# Patient Record
Sex: Male | Born: 1960
Health system: Southern US, Community
[De-identification: ages and names within clinical notes are randomized; demographics above are authoritative.]

## PROBLEM LIST (undated history)

## (undated) DIAGNOSIS — S61011A Laceration without foreign body of right thumb without damage to nail, initial encounter: Secondary | ICD-10-CM

---

## 1998-07-08 ENCOUNTER — Ambulatory Visit (HOSPITAL_COMMUNITY): Admission: RE | Admit: 1998-07-08 | Discharge: 1998-07-08 | Payer: Self-pay | Admitting: *Deleted

## 2013-04-16 ENCOUNTER — Ambulatory Visit (INDEPENDENT_AMBULATORY_CARE_PROVIDER_SITE_OTHER): Payer: PRIVATE HEALTH INSURANCE | Admitting: Physician Assistant

## 2013-04-16 VITALS — BP 116/74 | HR 80 | Temp 98.0°F | Resp 16 | Ht 68.0 in | Wt 151.6 lb

## 2013-04-16 DIAGNOSIS — J209 Acute bronchitis, unspecified: Secondary | ICD-10-CM

## 2013-04-16 DIAGNOSIS — R05 Cough: Secondary | ICD-10-CM

## 2013-04-16 DIAGNOSIS — R059 Cough, unspecified: Secondary | ICD-10-CM

## 2013-04-16 MED ORDER — AZITHROMYCIN 250 MG PO TABS: ORAL_TABLET | ORAL | Status: DC

## 2013-04-16 MED ORDER — HYDROCODONE-HOMATROPINE 5-1.5 MG/5ML PO SYRP: 5.0000 mL | ORAL_SOLUTION | Freq: Three times a day (TID) | ORAL | Status: DC | PRN

## 2013-04-16 NOTE — Progress Notes (Signed)
  Subjective:    Patient ID: Gene Nelson, male    DOB: 1960-10-09, 52 y.o.   MRN: [id number]  Sore Throat  Associated symptoms include congestion and coughing. Pertinent negatives include no ear pain, headaches, shortness of breath or vomiting.  Cough Associated symptoms include postnasal drip, rhinorrhea and a sore throat. Pertinent negatives include no chest pain, chills, ear pain, fever, headaches, shortness of breath or wheezing.   52 year old male presents for evaluation of cough, PND, and nasal congestion x 5 days. States symptoms started like a cold but he has now developed a productive cough.  Had low grade fever of 99.0  At the beginning but that has resolved. He has been afebrile x 3 days.  Has taken ibuprofen and an OTC sinus medications which did seem to help.  Denies SOB, wheezing, otalgia, sinus pain, chest pain, headache, or dizziness.   Patient is otherwise healthy with no other concerns today.     Review of Systems  Constitutional: Negative for fever and chills.  HENT: Positive for congestion, postnasal drip, rhinorrhea and sore throat. Negative for ear pain and sinus pressure.   Respiratory: Positive for cough. Negative for shortness of breath and wheezing.   Cardiovascular: Negative for chest pain.  Gastrointestinal: Negative for nausea and vomiting.  Neurological: Negative for dizziness and headaches.       Objective:   Physical Exam  Constitutional: He is oriented to person, place, and time. He appears well-developed and well-nourished.  HENT:  Head: Normocephalic and atraumatic.  Right Ear: Hearing, tympanic membrane, external ear and ear canal normal.  Left Ear: Hearing, tympanic membrane, external ear and ear canal normal.  Mouth/Throat: Uvula is midline, oropharynx is clear and moist and mucous membranes are normal. No oropharyngeal exudate (clear postnasal drainage).  Eyes: Conjunctivae are normal.  Neck: Normal range of motion. Neck supple.   Cardiovascular: Normal rate, regular rhythm and normal heart sounds.   Pulmonary/Chest: Effort normal and breath sounds normal.  Lymphadenopathy:    He has no cervical adenopathy.  Neurological: He is alert and oriented to person, place, and time.  Psychiatric: He has a normal mood and affect. His behavior is normal. Judgment and thought content normal.          Assessment & Plan:  Acute bronchitis - Plan: azithromycin (ZITHROMAX) 250 MG tablet  Cough - Plan: HYDROcodone-homatropine (HYCODAN) 5-1.5 MG/5ML syrup  Will cover with Zpack - use as directed Hycodan qhs prn cough. May use OTC Delsym during the day Increase fluids and rest.  Follow up if symptoms worsen or fail to improve

## 2014-04-20 ENCOUNTER — Ambulatory Visit (INDEPENDENT_AMBULATORY_CARE_PROVIDER_SITE_OTHER): Payer: BC Managed Care – PPO | Admitting: Podiatrist

## 2014-04-20 ENCOUNTER — Ambulatory Visit: Payer: PRIVATE HEALTH INSURANCE | Admitting: Podiatrist

## 2014-04-20 ENCOUNTER — Encounter: Payer: Self-pay | Admitting: Podiatrist

## 2014-04-20 VITALS — BP 136/85 | HR 66 | Resp 16

## 2014-04-20 DIAGNOSIS — L84 Corns and callosities: Secondary | ICD-10-CM

## 2014-04-20 NOTE — Progress Notes (Signed)
   Subjective:    Patient ID: Gene Nelson, male    DOB: 09/02/1960, 53 y.o.   MRN: [id number]  HPI Comments: "I have these things on my feet"  Patient c/o tender, tiny callused areas plantar forefoot bilateral and heel left for several months. He has tried OTC meds-helped temp, but came back.     Review of Systems  All other systems reviewed and are negative.      Objective:   Physical Exam Patient is awake, alert, and oriented x 3.  In no acute distress.  Vascular status is intact with palpable pedal pulses at 2/4 DP and PT bilateral and capillary refill time within normal limits. Neurological sensation is also intact bilaterally via Semmes Weinstein monofilament at 5/5 sites. Light touch, vibratory sensation, Achilles tendon reflex is intact. Dermatological exam reveals skin color, turger and texture as normal. No open lesions present.  Musculature intact with dorsiflexion, plantarflexion, inversion, eversion.  Bilateral feet have a line of porokeratotic lesions present in the crease between the first and second metatarsals.  Intact integument is noted post debridement.  No sign of infection present.  No redness, swelling or drainage present.  Patient subjectively relates pain in the feet.      Assessment & Plan:  Calluses bilateral feet  Plan:  Debridement of lesions carried out today without complication with a 15 blade. He will be seen back as needed for follow up.

## 2014-04-20 NOTE — Patient Instructions (Signed)
Corns and Calluses Corns are small areas of thickened skin that usually occur on the top, sides, or tip of a toe. They contain a cone-shaped core with a point that can press on a nerve below. This causes pain. Calluses are areas of thickened skin that usually develop on hands, fingers, palms, soles of the feet, and heels. These are areas that experience frequent friction or pressure. CAUSES  Corns are usually the result of rubbing (friction) or pressure from shoes that are too tight or do not fit properly. Calluses are caused by repeated friction and pressure on the affected areas. SYMPTOMS  A hard growth on the skin.  Pain or tenderness under the skin.  Sometimes, redness and swelling.  Increased discomfort while wearing tight-fitting shoes. DIAGNOSIS  Your caregiver can usually tell what the problem is by doing a physical exam. TREATMENT  Removing the cause of the friction or pressure is usually the only treatment needed. However, sometimes medicines can be used to help soften the hardened, thickened areas. These medicines include salicylic acid plasters and 12% ammonium lactate lotion. These medicines should only be used under the direction of your caregiver. HOME CARE INSTRUCTIONS   Try to remove pressure from the affected area.  You may wear donut-shaped corn pads to protect your skin.  You may use a pumice stone or nonmetallic nail file to gently reduce the thickness of a corn.  Wear properly fitted footwear.  If you have calluses on the hands, wear gloves during activities that cause friction.  If you have diabetes, you should regularly examine your feet. Tell your caregiver if you notice any problems with your feet. SEEK IMMEDIATE MEDICAL CARE IF:   You have increased pain, swelling, redness, or warmth in the affected area.  Your corn or callus starts to drain fluid or bleeds.  You are not getting better, even with treatment. Document Released: 02/29/2004 Document  Revised: 08/17/2011 Document Reviewed: 01/20/2011 Exit[employer] Patient Information 2015 [employer]. This information is not intended to replace advice given to you by your health care provider. Make sure you discuss any questions you have with your health care provider.  

## 2019-10-24 DIAGNOSIS — Z1211 Encounter for screening for malignant neoplasm of colon: Secondary | ICD-10-CM | POA: Diagnosis not present

## 2019-10-24 DIAGNOSIS — Z6823 Body mass index (BMI) 23.0-23.9, adult: Secondary | ICD-10-CM | POA: Diagnosis not present

## 2019-10-24 DIAGNOSIS — Z0000 Encounter for general adult medical examination without abnormal findings: Secondary | ICD-10-CM | POA: Diagnosis not present

## 2019-10-24 DIAGNOSIS — Z13220 Encounter for screening for lipoid disorders: Secondary | ICD-10-CM | POA: Diagnosis not present

## 2019-11-27 DIAGNOSIS — Z0000 Encounter for general adult medical examination without abnormal findings: Secondary | ICD-10-CM | POA: Diagnosis not present

## 2019-11-27 DIAGNOSIS — Z1211 Encounter for screening for malignant neoplasm of colon: Secondary | ICD-10-CM | POA: Diagnosis not present

## 2020-04-24 ENCOUNTER — Other Ambulatory Visit: Payer: Self-pay

## 2020-04-24 ENCOUNTER — Encounter (HOSPITAL_COMMUNITY): Payer: Self-pay | Admitting: *Deleted

## 2020-04-24 ENCOUNTER — Emergency Department (HOSPITAL_COMMUNITY)
Admission: EM | Admit: 2020-04-24 | Discharge: 2020-04-25 | Disposition: A | Discharge status: Home / Self Care | Payer: No Typology Code available for payment source | Attending: Emergency Medicine | Admitting: Emergency Medicine

## 2020-04-24 DIAGNOSIS — W268XXA Contact with other sharp object(s), not elsewhere classified, initial encounter: Secondary | ICD-10-CM | POA: Insufficient documentation

## 2020-04-24 DIAGNOSIS — Y990 Civilian activity done for income or pay: Secondary | ICD-10-CM | POA: Diagnosis not present

## 2020-04-24 DIAGNOSIS — S61011A Laceration without foreign body of right thumb without damage to nail, initial encounter: Secondary | ICD-10-CM | POA: Insufficient documentation

## 2020-04-24 DIAGNOSIS — Y9289 Other specified places as the place of occurrence of the external cause: Secondary | ICD-10-CM | POA: Diagnosis not present

## 2020-04-24 DIAGNOSIS — S61001A Unspecified open wound of right thumb without damage to nail, initial encounter: Secondary | ICD-10-CM | POA: Diagnosis not present

## 2020-04-24 DIAGNOSIS — S66221A Laceration of extensor muscle, fascia and tendon of right thumb at wrist and hand level, initial encounter: Secondary | ICD-10-CM | POA: Diagnosis not present

## 2020-04-24 NOTE — ED Triage Notes (Signed)
Pt arrives from [facility] facility. Laceration to the right thumb area from blade while trying to cut something at work today. Per their note, pt is unable to extend his thumb and they sent him here for further eval. Pt says when he tries to extend his thumb he feels some discomfort in his right forearm area. Tetanus rec'd at [facility].

## 2020-04-25 MED ORDER — LIDOCAINE HCL (PF) 1 % IJ SOLN
5.0000 mL | Freq: Once | INTRAMUSCULAR | Status: AC
  Administered 2020-04-25: 5 mL
  Filled 2020-04-25: qty 5

## 2020-04-25 NOTE — Progress Notes (Signed)
Orthopedic Tech Progress Note Patient Details:  Gene Nelson [birth year] [id number]  Ortho Devices Type of Ortho Device: Finger splint Ortho Device/Splint Location: RUE Ortho Device/Splint Interventions: Ordered, Application, Adjustment   Post Interventions Patient Tolerated: Well Instructions Provided: Care of device   [name] 04/25/2020, 1:23 AM

## 2020-04-25 NOTE — Discharge Instructions (Signed)
Follow up with Dr. Melvyn Novas for recheck of possible tendon injury right thumb.   Sutures can be removed in 7-10 days by your doctor or as directed by Dr. Melvyn Novas.   Take Tylenol as needed - 1000 mg every 6 hours. Maximum dose 4000 mg in 24 hours.

## 2020-04-25 NOTE — ED Provider Notes (Signed)
[facility] EMERGENCY DEPARTMENT Provider Note   CSN: [id number] Arrival date & time: 04/24/20  2037     History Chief Complaint  Patient presents with  . Laceration    Gene Nelson is a 59 y.o. male.  Patient to ED from Urgent Care where he was initially evaluated for a laceration to right thumb that occurred earlier today. UC concerned about possible tendon injury. He reports he has trouble moving the thumb in a full circle and pain in the radial forearm when he attempts to extend the thumb. No other injury. No numbness.   The history is provided by the patient. No language interpreter was used.       History reviewed. No pertinent past medical history.  There are no problems to display for this patient.   History reviewed. No pertinent surgical history.     No family history on file.  Social History   Tobacco Use  . Smoking status: Never Smoker  Substance Use Topics  . Alcohol use: No  . Drug use: No    Home Medications Prior to Admission medications   Not on File    Allergies    Patient has no known allergies.  Review of Systems   Review of Systems  Musculoskeletal:       See HPI.  Skin: Positive for wound.  Neurological: Negative for numbness.    Physical Exam Updated Vital Signs BP (!) 157/92 (BP Location: Left Arm)   Pulse 81   Temp 97.8 F (36.6 C) (Oral)   Resp 16   SpO2 100%   Physical Exam Vitals and nursing note reviewed.  Constitutional:      Appearance: He is well-developed.  Pulmonary:     Effort: Pulmonary effort is normal.  Musculoskeletal:        General: Normal range of motion.     Cervical back: Normal range of motion.     Comments: Patient has FROM of the thumb passively and against resistance. Full extension limited.  Skin:    General: Skin is warm and dry.     Comments: 1.5 cm laceration right dorsal 1st MCP, full thickness. Bleeding controlled. No swelling.   Neurological:     Mental Status:  He is alert and oriented to person, place, and time.     ED Results / Procedures / Treatments   Labs (all labs ordered are listed, but only abnormal results are displayed) Labs Reviewed - No data to display  EKG None  Radiology No results found.  Procedures .Marland KitchenLaceration Repair  Date/Time: 04/25/2020 1:09 AM Performed by: Elpidio Anis, PA-C Authorized by: Elpidio Anis, PA-C   Consent:    Consent obtained:  Verbal   Consent given by:  Patient Anesthesia (see MAR for exact dosages):    Anesthesia method:  Local infiltration   Local anesthetic:  Lidocaine 1% w/o epi Laceration details:    Location:  Finger   Finger location:  R thumb   Length (cm):  1.5 Repair type:    Repair type:  Simple Pre-procedure details:    Preparation:  Patient was prepped and draped in usual sterile fashion Exploration:    Hemostasis achieved with:  Direct pressure   Wound exploration: wound explored through full range of motion and entire depth of wound probed and visualized     Wound extent: no foreign bodies/material noted     Contaminated: no   Treatment:    Area cleansed with:  Betadine and saline Skin repair:  Repair method:  Sutures   Suture size:  5-0   Suture material:  Prolene   Suture technique:  Simple interrupted   Number of sutures:  3 Approximation:    Approximation:  Close Post-procedure details:    Dressing:  Antibiotic ointment and non-adherent dressing Comments:     No visualized tendon rupture. Finger splinted.   (including critical care time)  Medications Ordered in ED Medications  lidocaine (PF) (XYLOCAINE) 1 % injection 5 mL (has no administration in time range)    ED Course  I have reviewed the triage vital signs and the nursing notes.  Pertinent labs & imaging results that were available during my care of the patient were reviewed by me and considered in my medical decision making (see chart for details).    MDM Rules/Calculators/A&P                           Patient to ED for further evaluation of right thumb injury.   Small full thickness laceration. He has a FROM with limitation of full extension. Wound repaired, finger splinted. Will refer to hand ortho for repeat exam to evaluate for partial tendon injury.  Final Clinical Impression(s) / ED Diagnoses Final diagnoses:  None   1. Laceration right thumb  Rx / DC Orders ED Discharge Orders    None       Danne Harbor 04/25/20 0112    Zadie Rhine, MD 04/25/20 [phone]

## 2020-04-30 ENCOUNTER — Other Ambulatory Visit: Payer: Self-pay

## 2020-04-30 ENCOUNTER — Encounter (HOSPITAL_BASED_OUTPATIENT_CLINIC_OR_DEPARTMENT_OTHER): Payer: Self-pay | Admitting: Orthopaedic Surgery

## 2020-05-03 ENCOUNTER — Other Ambulatory Visit (HOSPITAL_COMMUNITY)
Admission: RE | Admit: 2020-05-03 | Discharge: 2020-05-03 | Disposition: A | Discharge status: Home / Self Care | Payer: BC Managed Care – PPO | Source: Ambulatory Visit | Attending: Orthopaedic Surgery | Admitting: Orthopaedic Surgery

## 2020-05-03 DIAGNOSIS — Z01812 Encounter for preprocedural laboratory examination: Secondary | ICD-10-CM | POA: Insufficient documentation

## 2020-05-03 DIAGNOSIS — Z20822 Contact with and (suspected) exposure to covid-19: Secondary | ICD-10-CM | POA: Insufficient documentation

## 2020-05-03 LAB — SARS CORONAVIRUS 2 (TAT 6-24 HRS): SARS Coronavirus 2: NEGATIVE

## 2020-05-06 ENCOUNTER — Ambulatory Visit (HOSPITAL_BASED_OUTPATIENT_CLINIC_OR_DEPARTMENT_OTHER): Payer: No Typology Code available for payment source | Admitting: Anesthesiology

## 2020-05-06 ENCOUNTER — Ambulatory Visit (HOSPITAL_BASED_OUTPATIENT_CLINIC_OR_DEPARTMENT_OTHER)
Admission: RE | Admit: 2020-05-06 | Discharge: 2020-05-06 | Disposition: A | Discharge status: Home / Self Care | Payer: No Typology Code available for payment source | Attending: Orthopaedic Surgery | Admitting: Orthopaedic Surgery

## 2020-05-06 ENCOUNTER — Encounter (HOSPITAL_BASED_OUTPATIENT_CLINIC_OR_DEPARTMENT_OTHER): Payer: Self-pay | Admitting: Orthopaedic Surgery

## 2020-05-06 ENCOUNTER — Encounter (HOSPITAL_BASED_OUTPATIENT_CLINIC_OR_DEPARTMENT_OTHER)
Admission: RE | Disposition: A | Discharge status: Home / Self Care | Payer: Self-pay | Source: Home / Self Care | Attending: Orthopaedic Surgery

## 2020-05-06 ENCOUNTER — Other Ambulatory Visit: Payer: Self-pay

## 2020-05-06 DIAGNOSIS — S66221A Laceration of extensor muscle, fascia and tendon of right thumb at wrist and hand level, initial encounter: Secondary | ICD-10-CM | POA: Insufficient documentation

## 2020-05-06 DIAGNOSIS — W268XXA Contact with other sharp object(s), not elsewhere classified, initial encounter: Secondary | ICD-10-CM | POA: Insufficient documentation

## 2020-05-06 DIAGNOSIS — S61011A Laceration without foreign body of right thumb without damage to nail, initial encounter: Secondary | ICD-10-CM | POA: Insufficient documentation

## 2020-05-06 HISTORY — DX: Laceration without foreign body of right thumb without damage to nail, initial encounter: S61.011A

## 2020-05-06 HISTORY — PX: REPAIR EXTENSOR TENDON: SHX5382

## 2020-05-06 SURGERY — REPAIR, TENDON, EXTENSOR
Anesthesia: Monitor Anesthesia Care | Site: Thumb | Laterality: Right

## 2020-05-06 MED ORDER — CEFAZOLIN SODIUM-DEXTROSE 2-4 GM/100ML-% IV SOLN
2.0000 g | INTRAVENOUS | Status: AC
  Administered 2020-05-06: 2 g via INTRAVENOUS

## 2020-05-06 MED ORDER — FENTANYL CITRATE (PF) 100 MCG/2ML IJ SOLN
100.0000 ug | Freq: Once | INTRAMUSCULAR | Status: AC
  Administered 2020-05-06: 100 ug via INTRAVENOUS

## 2020-05-06 MED ORDER — PROPOFOL 500 MG/50ML IV EMUL
INTRAVENOUS | Status: AC
  Filled 2020-05-06: qty 100

## 2020-05-06 MED ORDER — PROMETHAZINE HCL 25 MG/ML IJ SOLN: 6.2500 mg | INTRAMUSCULAR | Status: DC | PRN

## 2020-05-06 MED ORDER — LACTATED RINGERS IV SOLN: INTRAVENOUS | Status: DC

## 2020-05-06 MED ORDER — ONDANSETRON HCL 4 MG/2ML IJ SOLN
INTRAMUSCULAR | Status: DC | PRN
  Administered 2020-05-06: 4 mg via INTRAVENOUS

## 2020-05-06 MED ORDER — HYDROMORPHONE HCL 1 MG/ML IJ SOLN: 0.2500 mg | INTRAMUSCULAR | Status: DC | PRN

## 2020-05-06 MED ORDER — HYDROCODONE-ACETAMINOPHEN 5-325 MG PO TABS
1.0000 | ORAL_TABLET | ORAL | 0 refills | Status: AC | PRN
Start: 2020-05-06 — End: ?

## 2020-05-06 MED ORDER — ONDANSETRON HCL 4 MG/2ML IJ SOLN
INTRAMUSCULAR | Status: AC
  Filled 2020-05-06: qty 2

## 2020-05-06 MED ORDER — FENTANYL CITRATE (PF) 100 MCG/2ML IJ SOLN
INTRAMUSCULAR | Status: AC
  Filled 2020-05-06: qty 2

## 2020-05-06 MED ORDER — CHLORHEXIDINE GLUCONATE 4 % EX LIQD: 60.0000 mL | Freq: Once | CUTANEOUS | Status: DC

## 2020-05-06 MED ORDER — CEFAZOLIN SODIUM-DEXTROSE 2-4 GM/100ML-% IV SOLN
INTRAVENOUS | Status: AC
  Filled 2020-05-06: qty 100

## 2020-05-06 MED ORDER — MIDAZOLAM HCL 2 MG/2ML IJ SOLN
INTRAMUSCULAR | Status: AC
  Filled 2020-05-06: qty 2

## 2020-05-06 MED ORDER — MIDAZOLAM HCL 2 MG/2ML IJ SOLN
2.0000 mg | Freq: Once | INTRAMUSCULAR | Status: AC
  Administered 2020-05-06: 2 mg via INTRAVENOUS

## 2020-05-06 MED ORDER — OXYCODONE HCL 5 MG PO TABS: 5.0000 mg | ORAL_TABLET | Freq: Once | ORAL | Status: DC | PRN

## 2020-05-06 MED ORDER — ROPIVACAINE HCL 5 MG/ML IJ SOLN
INTRAMUSCULAR | Status: DC | PRN
  Administered 2020-05-06: 30 mL via PERINEURAL

## 2020-05-06 MED ORDER — POVIDONE-IODINE 10 % EX SWAB: 2.0000 "application " | Freq: Once | CUTANEOUS | Status: DC

## 2020-05-06 MED ORDER — PROPOFOL 500 MG/50ML IV EMUL
INTRAVENOUS | Status: DC | PRN
  Administered 2020-05-06: 100 ug/kg/min via INTRAVENOUS

## 2020-05-06 MED ORDER — OXYCODONE HCL 5 MG/5ML PO SOLN: 5.0000 mg | Freq: Once | ORAL | Status: DC | PRN

## 2020-05-06 SURGICAL SUPPLY — 55 items
BLADE SURG 15 STRL LF DISP TIS (BLADE) ×2 IMPLANT
BLADE SURG 15 STRL SS (BLADE) ×6
BNDG CMPR 9X4 STRL LF SNTH (GAUZE/BANDAGES/DRESSINGS) ×1
BNDG CONFORM 2 STRL LF (GAUZE/BANDAGES/DRESSINGS) ×2 IMPLANT
BNDG ELASTIC 3X5.8 VLCR STR LF (GAUZE/BANDAGES/DRESSINGS) ×5 IMPLANT
BNDG ESMARK 4X9 LF (GAUZE/BANDAGES/DRESSINGS) ×3 IMPLANT
BNDG GAUZE ELAST 4 BULKY (GAUZE/BANDAGES/DRESSINGS) ×2 IMPLANT
CANISTER SUCT 1200ML W/VALVE (MISCELLANEOUS) ×3 IMPLANT
CORD BIPOLAR FORCEPS 12FT (ELECTRODE) ×3 IMPLANT
COVER BACK TABLE 60X90IN (DRAPES) ×3 IMPLANT
COVER SURGICAL LIGHT HANDLE (MISCELLANEOUS) ×2 IMPLANT
COVER WAND RF STERILE (DRAPES) IMPLANT
CUFF TOURN SGL QUICK 18X4 (TOURNIQUET CUFF) ×2 IMPLANT
DECANTER SPIKE VIAL GLASS SM (MISCELLANEOUS) IMPLANT
DRAPE EXTREMITY T 121X128X90 (DISPOSABLE) ×3 IMPLANT
DRAPE IMP U-DRAPE 54X76 (DRAPES) ×1 IMPLANT
DRAPE OEC MINIVIEW 54X84 (DRAPES) IMPLANT
DRAPE SURG 17X23 STRL (DRAPES) ×3 IMPLANT
GAUZE 4X4 16PLY RFD (DISPOSABLE) IMPLANT
GAUZE XEROFORM 1X8 LF (GAUZE/BANDAGES/DRESSINGS) ×3 IMPLANT
GLOVE BIOGEL PI IND STRL 8 (GLOVE) IMPLANT
GLOVE BIOGEL PI INDICATOR 8 (GLOVE) ×2
GLOVE ECLIPSE 6.5 STRL STRAW (GLOVE) ×2 IMPLANT
GLOVE SURG SYN 7.5  E (GLOVE) ×3
GLOVE SURG SYN 7.5 E (GLOVE) ×1 IMPLANT
GLOVE SURG SYN 7.5 PF PI (GLOVE) IMPLANT
GLOVE SURG UNDER POLY LF SZ7 (GLOVE) ×4 IMPLANT
GOWN STRL REUS W/ TWL LRG LVL3 (GOWN DISPOSABLE) ×2 IMPLANT
GOWN STRL REUS W/TWL LRG LVL3 (GOWN DISPOSABLE) ×6
NDL HYPO 25X1 1.5 SAFETY (NEEDLE) ×1 IMPLANT
NEEDLE HYPO 22GX1.5 SAFETY (NEEDLE) IMPLANT
NEEDLE HYPO 25X1 1.5 SAFETY (NEEDLE) IMPLANT
NS IRRIG 1000ML POUR BTL (IV SOLUTION) ×3 IMPLANT
PACK BASIN DAY SURGERY FS (CUSTOM PROCEDURE TRAY) ×3 IMPLANT
PADDING CAST ABS 3INX4YD NS (CAST SUPPLIES) ×2
PADDING CAST ABS 4INX4YD NS (CAST SUPPLIES)
PADDING CAST ABS COTTON 3X4 (CAST SUPPLIES) ×1 IMPLANT
PADDING CAST ABS COTTON 4X4 ST (CAST SUPPLIES) ×1 IMPLANT
SHEET MEDIUM DRAPE 40X70 STRL (DRAPES) ×3 IMPLANT
SLEEVE SCD COMPRESS KNEE MED (MISCELLANEOUS) ×1 IMPLANT
SLING ARM FOAM STRAP LRG (SOFTGOODS) ×2 IMPLANT
SPLINT FIBERGLASS 3X35 (CAST SUPPLIES) ×3 IMPLANT
STOCKINETTE SYNTHETIC 3 UNSTER (CAST SUPPLIES) ×1 IMPLANT
SUCTION FRAZIER HANDLE 10FR (MISCELLANEOUS)
SUCTION TUBE FRAZIER 10FR DISP (MISCELLANEOUS) ×1 IMPLANT
SUT ETHIBOND 3-0 V-5 (SUTURE) ×4 IMPLANT
SUT PROLENE 4 0 PS 2 18 (SUTURE) ×5 IMPLANT
SYR BULB EAR ULCER 3OZ GRN STR (SYRINGE) ×3 IMPLANT
SYR CONTROL 10ML LL (SYRINGE) ×2 IMPLANT
TAPE SURG TRANSPORE 1 IN (GAUZE/BANDAGES/DRESSINGS) ×1 IMPLANT
TAPE SURGICAL TRANSPORE 1 IN (GAUZE/BANDAGES/DRESSINGS) ×3
TOWEL GREEN STERILE FF (TOWEL DISPOSABLE) ×6 IMPLANT
TUBE CONNECTING 20'X1/4 (TUBING)
TUBE CONNECTING 20X1/4 (TUBING) ×1 IMPLANT
UNDERPAD 30X36 HEAVY ABSORB (UNDERPADS AND DIAPERS) ×3 IMPLANT

## 2020-05-06 NOTE — Discharge Instructions (Signed)
°  Post Anesthesia Home Care Instructions  Activity: Get plenty of rest for the remainder of the day. A responsible individual must stay with you for 24 hours following the procedure.  For the next 24 hours, DO NOT: -Drive a car -Advertising copywriter -Drink alcoholic beverages -Take any medication unless instructed by your physician -Make any legal decisions or sign important papers.  Meals: Start with liquid foods such as gelatin or soup. Progress to regular foods as tolerated. Avoid greasy, spicy, heavy foods. If nausea and/or vomiting occur, drink only clear liquids until the nausea and/or vomiting subsides. Call your physician if vomiting continues.  Special Instructions/Symptoms: Your throat may feel dry or sore from the anesthesia or the breathing tube placed in your throat during surgery. If this causes discomfort, gargle with warm salt water. The discomfort should disappear within 24 hours.   Regional Anesthesia Blocks  1. Numbness or the inability to move the "blocked" extremity may last from 3-48 hours after placement. The length of time depends on the medication injected and your individual response to the medication. If the numbness is not going away after 48 hours, call your surgeon.  2. The extremity that is blocked will need to be protected until the numbness is gone and the  Strength has returned. Because you cannot feel it, you will need to take extra care to avoid injury. Because it may be weak, you may have difficulty moving it or using it. You may not know what position it is in without looking at it while the block is in effect.  3. For blocks in the legs and feet, returning to weight bearing and walking needs to be done carefully. You will need to wait until the numbness is entirely gone and the strength has returned. You should be able to move your leg and foot normally before you try and bear weight or walk. You will need someone to be with you when you first try to ensure  you do not fall and possibly risk injury.  4. Bruising and tenderness at the needle site are common side effects and will resolve in a few days.  5. Persistent numbness or new problems with movement should be communicated to the surgeon or the [facility] [phone] [facility] [phone]).   Discharge Instructions  - Keep dressings in place. Do not remove them. - The dressings must stay dry - Take all medication as prescribed. Transition to over the counter pain medication as your pain improves - Keep the hand elevated over the next 48-72 hours to help with pain and swelling - Move all digits not restricted by the dressings regularly to prevent stiffness - Please call to schedule a follow up appointment with Dr. Roney Mans and therapy at [phone] for 10-14 days following surgery - Your pain medication have been sent digitally to your pharmacy

## 2020-05-06 NOTE — Anesthesia Preprocedure Evaluation (Signed)
Anesthesia Evaluation  Patient identified by MRN, date of birth, ID band Patient awake    Reviewed: Allergy & Precautions, NPO status , Patient's Chart, lab work & pertinent test results  Airway Mallampati: II  TM Distance: >3 FB Neck ROM: Full    Dental no notable dental hx.    Pulmonary neg pulmonary ROS,    Pulmonary exam normal breath sounds clear to auscultation       Cardiovascular negative cardio ROS Normal cardiovascular exam Rhythm:Regular Rate:Normal     Neuro/Psych negative neurological ROS  negative psych ROS   GI/Hepatic negative GI ROS, Neg liver ROS,   Endo/Other  negative endocrine ROS  Renal/GU negative Renal ROS  negative genitourinary   Musculoskeletal negative musculoskeletal ROS (+)   Abdominal   Peds negative pediatric ROS (+)  Hematology negative hematology ROS (+)   Anesthesia Other Findings   Reproductive/Obstetrics negative OB ROS                             Anesthesia Physical Anesthesia Plan  ASA: II  Anesthesia Plan: MAC and Regional   Post-op Pain Management:    Induction: Intravenous  PONV Risk Score and Plan: 1 and Ondansetron and Treatment may vary due to age or medical condition  Airway Management Planned: Simple Face Mask  Additional Equipment:   Intra-op Plan:   Post-operative Plan:   Informed Consent: I have reviewed the patients History and Physical, chart, labs and discussed the procedure including the risks, benefits and alternatives for the proposed anesthesia with the patient or authorized representative who has indicated his/her understanding and acceptance.     Dental advisory given  Plan Discussed with: CRNA  Anesthesia Plan Comments:         Anesthesia Quick Evaluation

## 2020-05-06 NOTE — Progress Notes (Signed)
Assisted Dr. Miller with right, ultrasound guided, supraclavicular block. Side rails up, monitors on throughout procedure. See vital signs in flow sheet. Tolerated Procedure well. 

## 2020-05-06 NOTE — H&P (Signed)
ORTHOPAEDIC H&P  PCP:  Patient, No Pcp Per  Chief Complaint: Right thumb laceration  HPI: Gene Nelson is a 59 y.o. male who complains of  Right thumb laceration. Patient was using a box cutter at work when he sustained a laceration over the dorsal aspect of the thumb MP joint. He was seen in clinic where he had the inability to actively extend the thumb IP joint. There was concern for a laceration to the EPL tendon. I recommended proceeding forward with exploration of the thumb and repair of the EPL tendon. We also discussed possible EIP-EPL tendon transfer if needed. Patient presents for surgical treatment  Past Medical History:  Diagnosis Date  . Laceration of right thumb    History reviewed. No pertinent surgical history. Social History   Socioeconomic History  . Marital status: Married    Spouse name: Not on file  . Number of children: Not on file  . Years of education: Not on file  . Highest education level: Not on file  Occupational History  . Not on file  Tobacco Use  . Smoking status: Never Smoker  . Smokeless tobacco: Never Used  Substance and Sexual Activity  . Alcohol use: No  . Drug use: No  . Sexual activity: Yes  Other Topics Concern  . Not on file  Social History Narrative  . Not on file   Social Determinants of Health   Financial Resource Strain:   . Difficulty of Paying Living Expenses: Not on file  Food Insecurity:   . Worried About Programme researcher, broadcasting/film/video in the Last Year: Not on file  . Ran Out of Food in the Last Year: Not on file  Transportation Needs:   . Lack of Transportation (Medical): Not on file  . Lack of Transportation (Non-Medical): Not on file  Physical Activity:   . Days of Exercise per Week: Not on file  . Minutes of Exercise per Session: Not on file  Stress:   . Feeling of Stress : Not on file  Social Connections:   . Frequency of Communication with Friends and Family: Not on file  . Frequency of Social Gatherings with  Friends and Family: Not on file  . Attends Religious Services: Not on file  . Active Member of Clubs or Organizations: Not on file  . Attends Banker Meetings: Not on file  . Marital Status: Not on file   History reviewed. No pertinent family history. No Known Allergies Prior to Admission medications   Medication Sig Start Date End Date Taking? Authorizing Provider  cholecalciferol (VITAMIN D3) 25 MCG (1000 UNIT) tablet Take 1,000 Units by mouth daily.   Yes [provider]   No results found.  Positive ROS: All other systems have been reviewed and were otherwise negative with the exception of those mentioned in the HPI and as above.  Physical Exam:  Constitutional: Healthy-appearing and normal body habitus who is in NAD. Ambulation normal.  Psychiatric: Normal affect. Oriented x3  Cardiovascular: Right radial pulse 2+ and capillary refill test normal. Edema none.  Musculoskeletal: Right hand no atrophy. Strength right APB 5/5 and 1st DI 5/5.  Neurologic: Normal sensation to the ulnar nerve distribution, radial nerve distribution, and median nerve distribution.  Hand: Examination of the right upper extremity shows a small 1 cm laceration over the dorsal and ulnar aspect of the thumb MP joint. Sutures are intact. There is no significant swelling, erythema or signs of infection. Patient has intact motion at  the thumb MP joint with both flexion and extension. He does have active flexion to the thumb IP joint but he has decreased active extension to the thumb IP joint compared to the contralateral side. With active extension he has approximately 40 degree extensor lag through the thumb IP joint compared to the contralateral side. He has no significant tenderness palpation. His fingertips are warm well perfused with brisk capillary refill. His sensation is intact light touch throughout all digits.  Assessment: Right thumb laceration with EPL laceration  Plan: Plan  to proceed to surgery today for EPL tendon repair and if needed an EIP-EPL tendon transfer.   The risks/benefits and alternatives were discussed with patient once again. Consent obtained. Right thumb was marked.  Plan for discharge home post operatively and follow up with me in 10-14 days to start therapy.    Ernest Mallick, MD [phone]   05/06/2020 9:19 AM

## 2020-05-06 NOTE — Anesthesia Postprocedure Evaluation (Signed)
Anesthesia Post Note  Patient: Gene Nelson  Procedure(s) Performed: Right thumb extensor tendon repair (Right Thumb)     Patient location during evaluation: PACU Anesthesia Type: Regional Level of consciousness: awake and alert Pain management: pain level controlled Vital Signs Assessment: post-procedure vital signs reviewed and stable Respiratory status: spontaneous breathing, nonlabored ventilation and respiratory function stable Cardiovascular status: blood pressure returned to baseline and stable Postop Assessment: no apparent nausea or vomiting Anesthetic complications: no   No complications documented.  Last Vitals:  Vitals:   05/06/20 1220 05/06/20 1240  BP: (!) 160/91 (!) 152/86  Pulse: (!) 57 61  Resp: 15 16  Temp:  (!) 36.4 C  SpO2: 100% 98%    Last Pain:  Vitals:   05/06/20 1240  TempSrc:   PainSc: 0-No pain                 Lowella Curb

## 2020-05-06 NOTE — Op Note (Signed)
PREOPERATIVE DIAGNOSIS: Right thumb laceration with EPL laceration  POSTOPERATIVE DIAGNOSIS: Same  ATTENDING PHYSICIAN: Maudry Mayhew. Jeannie Fend, III, MD who was present and scrubbed for the entire case   ASSISTANT SURGEON: None.   ANESTHESIA: Regional with MAC  SURGICAL PROCEDURES:  1. Right thumb EPL repair over MP joint 2.  Right EPL transposition on the dorsum of the wrist  SURGICAL INDICATIONS: Patient is a 59 year old male who was seen and evaluated by me in clinic.  Approximately 2 weeks ago he was at work when he sustained a laceration to the dorsal aspect of the thumb with a box cutter.  This was initially treated at outside facility but since injury he has noticed inability to fully extend the thumb IP joint.  Based on his exam is concern for an EPL tendon laceration.  We discussed treatment options the patient did wish to proceed forward with surgical exploration of his laceration and repair of his EPL tendon as indicated.  We also discussed the need for potential EIP to EPL tendon transfer and he was agreeable with this as well.  FINDING: Laceration of the EPL tendon over the dorsal aspect of the thumb MP joint.  It had retracted back to Lister's tubercle.  The third dorsal compartment was released and the tendon was transferred radially.  Successful primary, direct repair of the EPL tendon was achieved.  DESCRIPTION OF PROCEDURE: Patient was identified in the preoperative holding area where the risk benefits and alternatives of the procedure were once again discussed the patient.  These risks include but not limited to infection, bleeding, damage to surrounding structures including blood vessels and nerves, pain, stiffness, tendon retear and need for additional procedures.  Informed consent was obtained at that time the patient's right hand was marked with a surgical marking pen.  He then underwent a right upper extremity plexus block by anesthesia.  He was brought to the operative suite  where timeout was performed identifying the correct patient operative site.  He was positioned supine on the operative table with his hand outstretched on a hand table.  A tourniquet was placed on the upper arm the patient was induced under MAC sedation.  Preoperative antibiotics were administered.  The right upper extremity was then prepped and draped in usual sterile fashion.  The limb was exsanguinated and the tourniquet was inflated.  There were 3 sutures over the dorsal aspect of thumb MP joint which were removed.  The laceration was extended both proximally and distally exposing the dorsum of the thumb.  Blunt dissection was carried down through the subcutaneous tissue.  The EPL tendon was identified distally and was found to be fully lacerated over the dorsal aspect of the thumb MP joint.  The EPB tendon was found as well but was fully intact.  Blunt dissection was performed through the initial wound in the proximal tendon stump was not visualized within the operative field.  A second incision was then made over the dorsal aspect of the wrist.  This was centered over the third compartment and Lister's tubercle.  Blunt dissection was carried down through the subcutaneous tissues.  The extensor retinaculum was visualized and the EPL tendon was found within the third compartment.  The tendon stump was identified and retracted into the wound.  The third compartment was then released to radially transposed the EPL tendon to increase its excursion.  3-0 Ethibond suture was then placed into the end of the tendon and used to shuttle the tendon under the skin and  into the first, initial surgical wound.  The thumb was then placed into full extension and the 2 tendon ends were able to be approximated.  Multiple interrupted 3-0 Ethibond sutures were placed in both horizontal mattress as well as figure-of-eight fashion.  This provided secure repair of the EPL tendon which had no tendon gapping with gentle thumb  range of motion.  At this point the wound was copiously irrigated with normal saline.  Both skin incisions were closed with interrupted 4-0 Prolene sutures.  Xeroform, 4 x 4's as well as a well-padded thumb spica splint holding the thumb IP joint in full extension were placed.  The tourniquet was released and the patient had return of brisk capillary refill to all of his digits.  He was awoken from his sedation and taken the PACU in stable condition.  He tolerated the procedure well and there were no complications.  ESTIMATED BLOOD LOSS: 10 mL  TOURNIQUET TIME: 33 minutes  SPECIMENS: None  POSTOPERATIVE PLAN: The patient will be discharged home and seen back  in the office in approximately 10-12 days for wound check, suture  removal, and then be sent to a therapist for splint fabrication and edema control.  We will go slow with his therapy in terms of his range of motion due to the tightness of his repair.  IMPLANTS: None

## 2020-05-06 NOTE — Transfer of Care (Signed)
Immediate Anesthesia Transfer of Care Note  Patient: Gene Nelson  Procedure(s) Performed: Right thumb extensor tendon repair, possible extensor indicis proprius to extensor pollicis longus tendon transfer and surgery as indicated (Right Thumb)  Patient Location: PACU  Anesthesia Type:MAC combined with regional for post-op pain  Level of Consciousness: awake, alert , oriented and patient cooperative  Airway & Oxygen Therapy: Patient Spontanous Breathing and Patient connected to nasal cannula oxygen  Post-op Assessment: Report given to RN and Post -op Vital signs reviewed and stable  Post vital signs: Reviewed and stable  Last Vitals:  Vitals Value Taken Time  BP    Temp    Pulse 62 05/06/20 1153  Resp 11 05/06/20 1153  SpO2 100 % 05/06/20 1153  Vitals shown include unvalidated device data.  Last Pain:  Vitals:   05/06/20 0928  TempSrc: Oral  PainSc: 0-No pain         Complications: No complications documented.

## 2020-05-06 NOTE — Anesthesia Procedure Notes (Signed)
Anesthesia Regional Block: Supraclavicular block   Pre-Anesthetic Checklist: ,, timeout performed, Correct Patient, Correct Site, Correct Laterality, Correct Procedure, Correct Position, site marked, Risks and benefits discussed,  Surgical consent,  Pre-op evaluation,  At surgeon's request and post-op pain management  Laterality: Right  Prep: chloraprep       Needles:  Injection technique: Single-shot  Needle Type: Stimiplex     Needle Length: 9cm  Needle Gauge: 21     Additional Needles:   Procedures:,,,, ultrasound used (permanent image in chart),,,,  Narrative:  Start time: 05/06/2020 10:16 AM End time: 05/06/2020 10:21 AM Injection made incrementally with aspirations every 5 mL.  Performed by: Personally  Anesthesiologist: Lowella Curb, MD

## 2020-05-07 ENCOUNTER — Encounter (HOSPITAL_BASED_OUTPATIENT_CLINIC_OR_DEPARTMENT_OTHER): Payer: Self-pay | Admitting: Orthopaedic Surgery

## 2020-11-14 DIAGNOSIS — Z23 Encounter for immunization: Secondary | ICD-10-CM | POA: Diagnosis not present

## 2020-11-14 DIAGNOSIS — Z125 Encounter for screening for malignant neoplasm of prostate: Secondary | ICD-10-CM | POA: Diagnosis not present

## 2020-11-14 DIAGNOSIS — Z0000 Encounter for general adult medical examination without abnormal findings: Secondary | ICD-10-CM | POA: Diagnosis not present

## 2020-11-14 DIAGNOSIS — Z13220 Encounter for screening for lipoid disorders: Secondary | ICD-10-CM | POA: Diagnosis not present

## 2021-04-07 ENCOUNTER — Other Ambulatory Visit: Payer: Self-pay | Admitting: Internal Medicine

## 2021-04-07 ENCOUNTER — Ambulatory Visit
Admission: RE | Admit: 2021-04-07 | Discharge: 2021-04-07 | Disposition: A | Discharge status: Home / Self Care | Payer: BC Managed Care – PPO | Source: Ambulatory Visit | Attending: Internal Medicine | Admitting: Internal Medicine

## 2021-04-07 DIAGNOSIS — R059 Cough, unspecified: Secondary | ICD-10-CM

## 2021-04-07 DIAGNOSIS — E782 Mixed hyperlipidemia: Secondary | ICD-10-CM | POA: Diagnosis not present

## 2021-04-07 DIAGNOSIS — Z23 Encounter for immunization: Secondary | ICD-10-CM | POA: Diagnosis not present

## 2021-09-26 DIAGNOSIS — L503 Dermatographic urticaria: Secondary | ICD-10-CM | POA: Diagnosis not present

## 2021-09-26 DIAGNOSIS — L258 Unspecified contact dermatitis due to other agents: Secondary | ICD-10-CM | POA: Diagnosis not present

## 2022-01-16 DIAGNOSIS — L818 Other specified disorders of pigmentation: Secondary | ICD-10-CM | POA: Diagnosis not present

## 2022-01-16 DIAGNOSIS — L503 Dermatographic urticaria: Secondary | ICD-10-CM | POA: Diagnosis not present

## 2022-01-16 DIAGNOSIS — L819 Disorder of pigmentation, unspecified: Secondary | ICD-10-CM | POA: Diagnosis not present

## 2022-05-22 DIAGNOSIS — Z125 Encounter for screening for malignant neoplasm of prostate: Secondary | ICD-10-CM | POA: Diagnosis not present

## 2022-05-22 DIAGNOSIS — R030 Elevated blood-pressure reading, without diagnosis of hypertension: Secondary | ICD-10-CM | POA: Diagnosis not present

## 2022-05-22 DIAGNOSIS — E785 Hyperlipidemia, unspecified: Secondary | ICD-10-CM | POA: Diagnosis not present

## 2022-05-22 DIAGNOSIS — Z0000 Encounter for general adult medical examination without abnormal findings: Secondary | ICD-10-CM | POA: Diagnosis not present

## 2022-06-26 DIAGNOSIS — L819 Disorder of pigmentation, unspecified: Secondary | ICD-10-CM | POA: Diagnosis not present

## 2022-06-26 DIAGNOSIS — L503 Dermatographic urticaria: Secondary | ICD-10-CM | POA: Diagnosis not present

## 2022-12-25 DIAGNOSIS — L2082 Flexural eczema: Secondary | ICD-10-CM | POA: Diagnosis not present

## 2022-12-25 DIAGNOSIS — L819 Disorder of pigmentation, unspecified: Secondary | ICD-10-CM | POA: Diagnosis not present

## 2023-04-02 DIAGNOSIS — R509 Fever, unspecified: Secondary | ICD-10-CM | POA: Diagnosis not present

## 2023-04-02 DIAGNOSIS — Z03818 Encounter for observation for suspected exposure to other biological agents ruled out: Secondary | ICD-10-CM | POA: Diagnosis not present

## 2023-04-02 DIAGNOSIS — R0981 Nasal congestion: Secondary | ICD-10-CM | POA: Diagnosis not present

## 2023-04-02 DIAGNOSIS — R051 Acute cough: Secondary | ICD-10-CM | POA: Diagnosis not present

## 2023-04-20 IMAGING — DX DG CHEST 2V
2 series · 2 of 2 positions shown · non-contrast
Comparison: None.

CLINICAL DATA: 59-year-old male with cough

EXAM:
CHEST - 2 VIEW

[dg chest 2 view (1 of 2)]
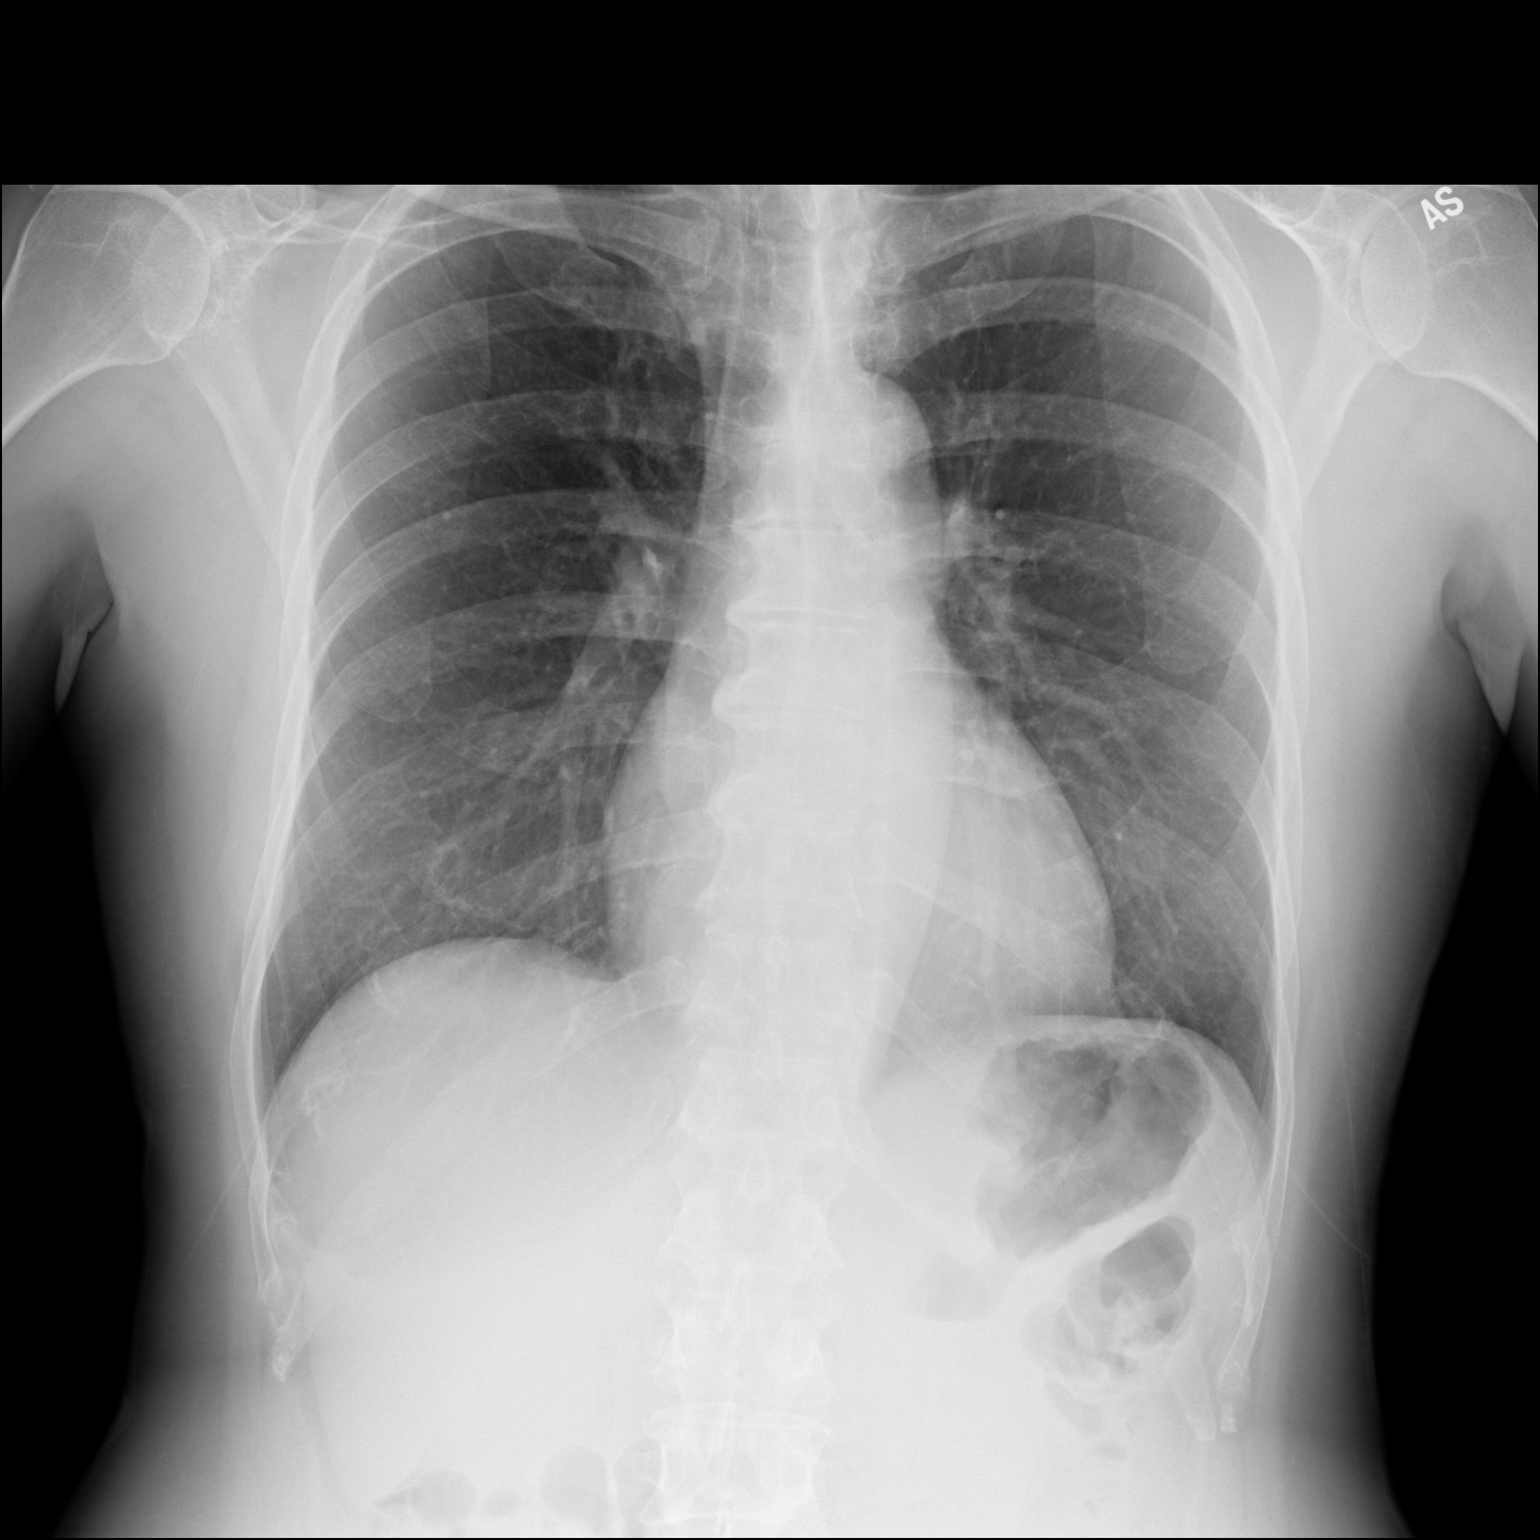

[dg chest 2 view (2 of 2)]
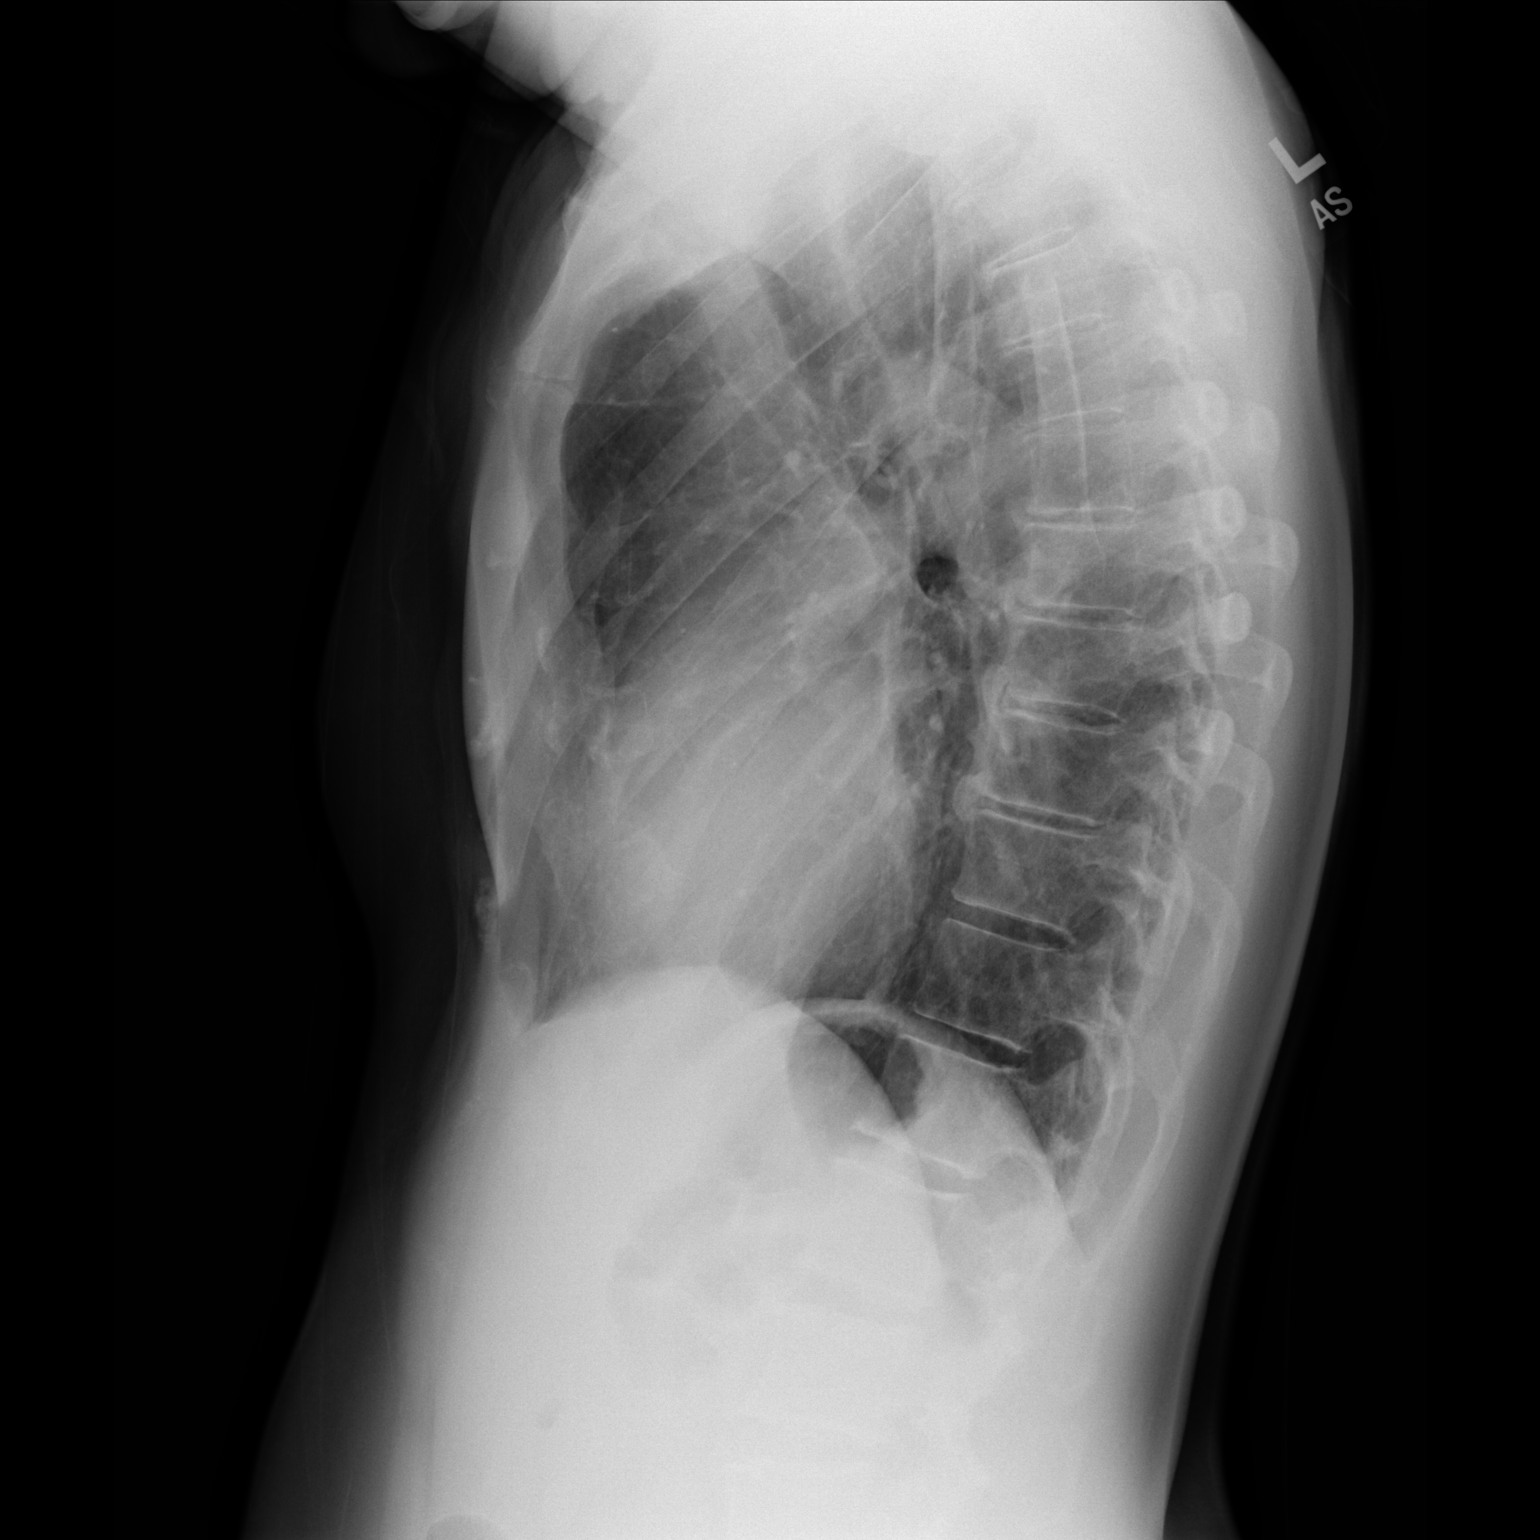

[2 of 2 positions shown; findings below may reference images not displayed]

FINDINGS: Cardiomediastinal silhouette within normal limits in size and
contour. No evidence of central vascular congestion. No interlobular
septal thickening.

No pneumothorax or pleural effusion. Coarsened interstitial
markings, with no confluent airspace disease.

No acute displaced fracture. Mild degenerative changes of the spine.
IMPRESSION: No active cardiopulmonary disease.

## 2023-05-26 ENCOUNTER — Other Ambulatory Visit: Payer: Self-pay | Admitting: Internal Medicine

## 2023-05-26 DIAGNOSIS — E785 Hyperlipidemia, unspecified: Secondary | ICD-10-CM | POA: Diagnosis not present

## 2023-05-26 DIAGNOSIS — R911 Solitary pulmonary nodule: Secondary | ICD-10-CM

## 2023-05-26 DIAGNOSIS — Z0000 Encounter for general adult medical examination without abnormal findings: Secondary | ICD-10-CM | POA: Diagnosis not present

## 2023-05-26 DIAGNOSIS — R059 Cough, unspecified: Secondary | ICD-10-CM | POA: Diagnosis not present

## 2023-05-26 DIAGNOSIS — Z125 Encounter for screening for malignant neoplasm of prostate: Secondary | ICD-10-CM | POA: Diagnosis not present

## 2023-06-22 ENCOUNTER — Ambulatory Visit
Admission: RE | Admit: 2023-06-22 | Discharge: 2023-06-22 | Disposition: A | Discharge status: Home / Self Care | Payer: BC Managed Care – PPO | Source: Ambulatory Visit | Attending: Internal Medicine | Admitting: Internal Medicine

## 2023-06-22 DIAGNOSIS — I700 Atherosclerosis of aorta: Secondary | ICD-10-CM | POA: Diagnosis not present

## 2023-06-22 DIAGNOSIS — R911 Solitary pulmonary nodule: Secondary | ICD-10-CM

## 2023-06-22 DIAGNOSIS — R059 Cough, unspecified: Secondary | ICD-10-CM | POA: Diagnosis not present

## 2023-06-22 MED ORDER — IOPAMIDOL (ISOVUE-300) INJECTION 61%
75.0000 mL | Freq: Once | INTRAVENOUS | Status: AC | PRN
  Administered 2023-06-22: 75 mL via INTRAVENOUS

## 2023-12-09 DIAGNOSIS — L2082 Flexural eczema: Secondary | ICD-10-CM | POA: Diagnosis not present

## 2023-12-09 DIAGNOSIS — L818 Other specified disorders of pigmentation: Secondary | ICD-10-CM | POA: Diagnosis not present

## 2023-12-09 DIAGNOSIS — L819 Disorder of pigmentation, unspecified: Secondary | ICD-10-CM | POA: Diagnosis not present

## 2024-01-05 DIAGNOSIS — Z1211 Encounter for screening for malignant neoplasm of colon: Secondary | ICD-10-CM | POA: Diagnosis not present

## 2024-01-05 DIAGNOSIS — R634 Abnormal weight loss: Secondary | ICD-10-CM | POA: Diagnosis not present

## 2024-01-05 DIAGNOSIS — R5383 Other fatigue: Secondary | ICD-10-CM | POA: Diagnosis not present

## 2024-05-16 DIAGNOSIS — H52223 Regular astigmatism, bilateral: Secondary | ICD-10-CM | POA: Diagnosis not present

## 2024-05-16 DIAGNOSIS — H25813 Combined forms of age-related cataract, bilateral: Secondary | ICD-10-CM | POA: Diagnosis not present

## 2024-05-16 DIAGNOSIS — H18413 Arcus senilis, bilateral: Secondary | ICD-10-CM | POA: Diagnosis not present

## 2024-05-16 DIAGNOSIS — H524 Presbyopia: Secondary | ICD-10-CM | POA: Diagnosis not present
